# Patient Record
Sex: Female | Born: 2005 | Race: Black or African American | Hispanic: No | Marital: Single | Smoking: Never smoker
Health system: Southern US, Community
[De-identification: ages and names within clinical notes are randomized; demographics above are authoritative.]

## PROBLEM LIST (undated history)

## (undated) DIAGNOSIS — F32A Depression, unspecified: Secondary | ICD-10-CM

## (undated) DIAGNOSIS — J45909 Unspecified asthma, uncomplicated: Secondary | ICD-10-CM

## (undated) DIAGNOSIS — F419 Anxiety disorder, unspecified: Secondary | ICD-10-CM

## (undated) DIAGNOSIS — Q249 Congenital malformation of heart, unspecified: Secondary | ICD-10-CM

## (undated) HISTORY — DX: Congenital malformation of heart, unspecified: Q24.9

## (undated) HISTORY — DX: Unspecified asthma, uncomplicated: J45.909

---
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
## 2024-01-05 ENCOUNTER — Ambulatory Visit: Admitting: Professional Counselor

## 2024-01-11 ENCOUNTER — Ambulatory Visit (INDEPENDENT_AMBULATORY_CARE_PROVIDER_SITE_OTHER): Payer: Self-pay | Admitting: Professional Counselor

## 2024-01-11 DIAGNOSIS — Z0389 Encounter for observation for other suspected diseases and conditions ruled out: Secondary | ICD-10-CM

## 2024-01-11 NOTE — Progress Notes (Signed)
 Patient did not show for scheduled appointment. No message.  Almarie Sprang, [facility]

## 2024-01-25 ENCOUNTER — Ambulatory Visit: Admitting: Psychiatry
